# Patient Record
Sex: Male | Born: 2005
Health system: Southern US, Community
[De-identification: ages and names within clinical notes are randomized; demographics above are authoritative.]

## PROBLEM LIST (undated history)

## (undated) DIAGNOSIS — E230 Hypopituitarism: Secondary | ICD-10-CM

## (undated) DIAGNOSIS — J45909 Unspecified asthma, uncomplicated: Secondary | ICD-10-CM

## (undated) DIAGNOSIS — E236 Other disorders of pituitary gland: Secondary | ICD-10-CM

## (undated) DIAGNOSIS — T7840XA Allergy, unspecified, initial encounter: Secondary | ICD-10-CM

## (undated) DIAGNOSIS — F909 Attention-deficit hyperactivity disorder, unspecified type: Secondary | ICD-10-CM

## (undated) DIAGNOSIS — F32A Depression, unspecified: Secondary | ICD-10-CM

## (undated) DIAGNOSIS — L709 Acne, unspecified: Secondary | ICD-10-CM

## (undated) DIAGNOSIS — J302 Other seasonal allergic rhinitis: Secondary | ICD-10-CM

## (undated) DIAGNOSIS — Z8669 Personal history of other diseases of the nervous system and sense organs: Secondary | ICD-10-CM

## (undated) DIAGNOSIS — R519 Headache, unspecified: Secondary | ICD-10-CM

## (undated) DIAGNOSIS — Z91018 Allergy to other foods: Secondary | ICD-10-CM

## (undated) HISTORY — DX: Allergy, unspecified, initial encounter: T78.40XA

## (undated) HISTORY — DX: Personal history of other diseases of the nervous system and sense organs: Z86.69

## (undated) HISTORY — DX: Other seasonal allergic rhinitis: J30.2

## (undated) HISTORY — DX: Attention-deficit hyperactivity disorder, unspecified type: F90.9

## (undated) HISTORY — DX: Depression, unspecified: F32.A

## (undated) HISTORY — DX: Hypopituitarism: E23.0

## (undated) HISTORY — DX: Acne, unspecified: L70.9

## (undated) HISTORY — DX: Other disorders of pituitary gland: E23.6

## (undated) HISTORY — DX: Headache, unspecified: R51.9

---
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
## 2024-03-07 ENCOUNTER — Telehealth: Payer: Self-pay | Admitting: *Deleted

## 2024-03-07 ENCOUNTER — Encounter: Payer: Self-pay | Admitting: Internal Medicine

## 2024-03-07 NOTE — Telephone Encounter (Signed)
 Copied from CRM [id number]. Topic: Appointments - Transfer of Care >> Mar 07, 2024 10:24 AM Cammy Copa D wrote: Pt is requesting to transfer FROM: Aggie Hacker, MD Pt is requesting to transfer TO: Lula Olszewski, MD Reason for requested transfer: PT is turning 18 in May and will no longer be a pediatric patient, needs an adult doctor. It is the responsibility of the team the patient would like to transfer to (Dr. Lula Olszewski, MD) to reach out to the patient if for any reason this transfer is not acceptable.

## 2024-05-14 ENCOUNTER — Telehealth: Payer: Self-pay

## 2024-05-14 NOTE — Telephone Encounter (Signed)
 Communication  Reason for CRM: Patient called because he was advised that he had to reschedule his appointment with Dr. Johnette Naegeli from 05/28/24 and the next available appointment is 06/20/24 and he is concerned because he will run out of his ADHD medication: Amphetamine  Sulfate (EVEKEO ) 10 MG TABS [[id number]] before then. Patient is requesting assistance on what to do before appointment without his refill.   Pt rescheduled so he will not run out of meds.

## 2024-05-20 ENCOUNTER — Other Ambulatory Visit: Payer: Self-pay

## 2024-05-23 ENCOUNTER — Encounter: Payer: Self-pay | Admitting: Family Medicine

## 2024-05-23 ENCOUNTER — Ambulatory Visit: Payer: Self-pay | Admitting: Family Medicine

## 2024-05-23 VITALS — BP 103/67 | HR 85 | Temp 98.4°F | Resp 12 | Ht 69.75 in | Wt 144.2 lb

## 2024-05-23 DIAGNOSIS — Z0000 Encounter for general adult medical examination without abnormal findings: Secondary | ICD-10-CM | POA: Diagnosis not present

## 2024-05-23 DIAGNOSIS — F902 Attention-deficit hyperactivity disorder, combined type: Secondary | ICD-10-CM

## 2024-05-23 NOTE — Patient Instructions (Signed)
 Health Maintenance, Male  Adopting a healthy lifestyle and getting preventive care are important in promoting health and wellness. Ask your health care provider about:  The right schedule for you to have regular tests and exams.  Things you can do on your own to prevent diseases and keep yourself healthy.  What should I know about diet, weight, and exercise?  Eat a healthy diet    Eat a diet that includes plenty of vegetables, fruits, low-fat dairy products, and lean protein.  Do not eat a lot of foods that are high in solid fats, added sugars, or sodium.  Maintain a healthy weight  Body mass index (BMI) is a measurement that can be used to identify possible weight problems. It estimates body fat based on height and weight. Your health care provider can help determine your BMI and help you achieve or maintain a healthy weight.  Get regular exercise  Get regular exercise. This is one of the most important things you can do for your health. Most adults should:  Exercise for at least 150 minutes each week. The exercise should increase your heart rate and make you sweat (moderate-intensity exercise).  Do strengthening exercises at least twice a week. This is in addition to the moderate-intensity exercise.  Spend less time sitting. Even light physical activity can be beneficial.  Watch cholesterol and blood lipids  Have your blood tested for lipids and cholesterol at 19 years of age, then have this test every 5 years.  You may need to have your cholesterol levels checked more often if:  Your lipid or cholesterol levels are high.  You are older than 19 years of age.  You are at high risk for heart disease.  What should I know about cancer screening?  Many types of cancers can be detected early and may often be prevented. Depending on your health history and family history, you may need to have cancer screening at various ages. This may include screening for:  Colorectal cancer.  Prostate cancer.  Skin cancer.  Lung  cancer.  What should I know about heart disease, diabetes, and high blood pressure?  Blood pressure and heart disease  High blood pressure causes heart disease and increases the risk of stroke. This is more likely to develop in people who have high blood pressure readings or are overweight.  Talk with your health care provider about your target blood pressure readings.  Have your blood pressure checked:  Every 3-5 years if you are 9-95 years of age.  Every year if you are 85 years old or older.  If you are between the ages of 29 and 29 and are a current or former smoker, ask your health care provider if you should have a one-time screening for abdominal aortic aneurysm (AAA).  Diabetes  Have regular diabetes screenings. This checks your fasting blood sugar level. Have the screening done:  Once every three years after age 23 if you are at a normal weight and have a low risk for diabetes.  More often and at a younger age if you are overweight or have a high risk for diabetes.  What should I know about preventing infection?  Hepatitis B  If you have a higher risk for hepatitis B, you should be screened for this virus. Talk with your health care provider to find out if you are at risk for hepatitis B infection.  Hepatitis C  Blood testing is recommended for:  Everyone born from [birth year] through 1966.  Anyone  with known risk factors for hepatitis C.  Sexually transmitted infections (STIs)  You should be screened each year for STIs, including gonorrhea and chlamydia, if:  You are sexually active and are younger than 19 years of age.  You are older than 19 years of age and your health care provider tells you that you are at risk for this type of infection.  Your sexual activity has changed since you were last screened, and you are at increased risk for chlamydia or gonorrhea. Ask your health care provider if you are at risk.  Ask your health care provider about whether you are at high risk for HIV. Your health care provider  may recommend a prescription medicine to help prevent HIV infection. If you choose to take medicine to prevent HIV, you should first get tested for HIV. You should then be tested every 3 months for as long as you are taking the medicine.  Follow these instructions at home:  Alcohol use  Do not drink alcohol if your health care provider tells you not to drink.  If you drink alcohol:  Limit how much you have to 0-2 drinks a day.  Know how much alcohol is in your drink. In the [place] one drink equals one 12 oz bottle of beer (355 mL), one 5 oz glass of wine (148 mL), or one 1 oz glass of hard liquor (44 mL).  Lifestyle  Do not use any products that contain nicotine or tobacco. These products include cigarettes, chewing tobacco, and vaping devices, such as e-cigarettes. If you need help quitting, ask your health care provider.  Do not use street drugs.  Do not share needles.  Ask your health care provider for help if you need support or information about quitting drugs.  General instructions  Schedule regular health, dental, and eye exams.  Stay current with your vaccines.  Tell your health care provider if:  You often feel depressed.  You have ever been abused or do not feel safe at home.  Summary  Adopting a healthy lifestyle and getting preventive care are important in promoting health and wellness.  Follow your health care provider's instructions about healthy diet, exercising, and getting tested or screened for diseases.  Follow your health care provider's instructions on monitoring your cholesterol and blood pressure.  This information is not intended to replace advice given to you by your health care provider. Make sure you discuss any questions you have with your health care provider.  Document Revised: 04/11/2021 Document Reviewed: 04/11/2021  Elsevier Patient Education  2024 [employer].

## 2024-05-23 NOTE — Progress Notes (Signed)
 Office Note 05/23/2024  CC:  Chief Complaint  Patient presents with   Establish Care    HPI:  Duane Ward is a 18 y.o. male who is here accompanied by his mom to establish care, discuss ADHD. Patient's most recent primary MD: Dr. Candelaria Chaco, pediatrics Old records in [facility] EMR were reviewed prior to or during today's visit.  Feeling well. Has been on Ezekiel 10 mg 1-2 times a day and guanfacine  3 mg a day for 10 years.  He feels like this is helpful/effective for his ADHD symptoms. He also takes sertraline  75 mg a day for history of anxiety and depression. He has been stable on this for a long time as well.    He graduated from [employer].  Plans on attending [employer] in the fall as a mechanical engineering major, hopes to have a focus in aerodynamics.  He did have a history of growth hormone deficiency and was on growth hormone for quite a while.  He reached normal growth and plateaued/showed stability so he has been released from endocrinology. Additionally, he had a pituitary growth that neurology monitored over quite a while and it was stable so he has been released from neurology follow-up.  He does get annual optometry follow-up to make sure there are no peripheral field visual abnormalities occurring.  Past Medical History:  Diagnosis Date   ADHD    Asthma    Depression    Growth hormone deficiency (HCC)    released from specialist care   Hx of migraines    Multiple food allergies    Pituitary mass (HCC)    Released from specialist care due to stability.   Seasonal allergies     History reviewed. No pertinent surgical history.  Family History  Problem Relation Age of Onset   Asthma Mother    Birth defects Mother    Depression Mother    Learning disabilities Father    Birth defects Brother    Hyperlipidemia Maternal Grandfather    Hypertension Maternal Grandfather    Heart attack Maternal Grandfather    Heart disease Maternal  Grandfather    Cancer Paternal Grandfather    Depression Paternal Grandfather    Hyperlipidemia Paternal Grandfather    Asthma Maternal Aunt     Social History   Socioeconomic History   Marital status: Single    Spouse name: Not on file   Number of children: Not on file   Years of education: Not on file   Highest education level: Not on file  Occupational History   Not on file  Tobacco Use   Smoking status: Never   Smokeless tobacco: Never  Vaping Use   Vaping status: Never Used  Substance and Sexual Activity   Alcohol use: Never   Drug use: Never   Sexual activity: Never  Other Topics Concern   Not on file  Social History Narrative   [employer] high [employer] grad.  Has 1 younger brother.   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   Social Drivers of Corporate investment banker Strain: Not on file  Food Insecurity: Not on file  Transportation Needs: Not on file  Physical Activity: Not on file  Stress: Not on file  Social Connections: Unknown (01/08/2023)   Received from [facility]   Social Network    Social Network: Not on file  Intimate Partner Violence: Unknown (01/08/2023)   Received from [facility]  HITS    Physically Hurt: Not on file    Insult or Talk Down To: Not on file    Threaten Physical Harm: Not on file    Scream or Curse: Not on file    Outpatient Encounter Medications as of 05/23/2024  Medication Sig   albuterol  (VENTOLIN  HFA) 108 (90 Base) MCG/ACT inhaler Inhale 1-2 puffs into the lungs every 4 (four) hours as needed for wheezing (or cough).   Amphetamine  Sulfate (EVEKEO ) 10 MG TABS Take 1 tablet by mouth 2 (two) times daily as needed.   Budesonide (PULMICORT FLEXHALER) 90 MCG/ACT inhaler Inhale 1 puff into the lungs 2 (two) times daily.   EPINEPHrine 0.3 mg/0.3 mL IJ SOAJ injection Inject 0.3 mg into the muscle once as needed (Anaphylaxis).   GuanFACINE  HCl 3 MG TB24 Take 1 tablet (3 mg total) by mouth  daily.   Saccharomyces boulardii (PROBIOTIC) 250 MG CAPS Take by mouth.   sertraline  (ZOLOFT ) 50 MG tablet Take 75 mg by mouth daily.   No facility-administered encounter medications on file as of 05/23/2024.    Allergies  Allergen Reactions   Egg-Derived Products Anaphylaxis    Review of Systems  Constitutional:  Negative for appetite change, chills, fatigue and fever.  HENT:  Negative for congestion, dental problem, ear pain and sore throat.   Eyes:  Negative for discharge, redness and visual disturbance.  Respiratory:  Negative for cough, chest tightness, shortness of breath and wheezing.   Cardiovascular:  Negative for chest pain, palpitations and leg swelling.  Gastrointestinal:  Negative for abdominal pain, blood in stool, diarrhea, nausea and vomiting.  Genitourinary:  Negative for difficulty urinating, dysuria, flank pain, frequency, hematuria and urgency.  Musculoskeletal:  Negative for arthralgias, back pain, joint swelling, myalgias and neck stiffness.  Skin:  Negative for pallor and rash.  Neurological:  Negative for dizziness, speech difficulty, weakness and headaches.  Hematological:  Negative for adenopathy. Does not bruise/bleed easily.  Psychiatric/Behavioral:  Negative for confusion and sleep disturbance. The patient is not nervous/anxious.     PE; Blood pressure 103/67, pulse 85, temperature 98.4 F (36.9 C), temperature source Oral, resp. rate 12, height 5' 9.75 (1.772 m), weight 144 lb 3.2 oz (65.4 kg), SpO2 98%. Body mass index is 20.84 kg/m.  Physical Exam  Gen: Alert, well appearing.  Patient is oriented to person, place, time, and situation. AFFECT: pleasant, lucid thought and speech. ENT: Ears: EACs clear, normal epithelium.  TMs with good light reflex and landmarks bilaterally.  Eyes: no injection, icteris, swelling, or exudate.  EOMI, PERRLA. Nose: no drainage or turbinate edema/swelling.  No injection or focal lesion.  Mouth: lips without  lesion/swelling.  Oral mucosa pink and moist.  Dentition intact and without obvious caries or gingival swelling.  Oropharynx without erythema, exudate, or swelling.  Neck: supple/nontender.  No LAD, mass, or TM.  Carotid pulses 2+ bilaterally, without bruits. CV: RRR, no m/r/g.   LUNGS: CTA bilat, nonlabored resps, good aeration in all lung fields. ABD: soft, NT, ND, BS normal.  No hepatospenomegaly or mass.  No bruits. EXT: no clubbing, cyanosis, or edema.  Musculoskeletal: no joint swelling, erythema, warmth, or tenderness.  ROM of all joints intact. Skin - no sores or suspicious lesions or rashes or color changes  Pertinent labs:  none  ASSESSMENT AND PLAN:   New patient, establishing care.  #1 health maintenance exam: Reviewed age and gender appropriate health maintenance issues (prudent diet, regular exercise, health risks of tobacco and excessive alcohol, use of  seatbelts, fire alarms in home, use of sunscreen).  Also reviewed age and gender appropriate health screening as well as vaccine recommendations. Vaccines: None Labs: None  #2 ADHD, doing well long-term on if Evekeo  10 mg twice daily and guanfacine  3 mg a day. Additionally, he is stable on sertraline  50 mg tabs, 1-1/2 daily for anxiety/depression. He will call later this summer for refills.  An After Visit Summary was printed and given to the patient.  Return in about 6 months (around 11/22/2024) for f/u ADD.  Signed:  Arletha Lady, MD           05/23/2024

## 2024-05-28 ENCOUNTER — Ambulatory Visit: Payer: Self-pay | Admitting: Family Medicine

## 2024-06-09 ENCOUNTER — Ambulatory Visit: Payer: Self-pay | Admitting: Internal Medicine

## 2024-06-18 ENCOUNTER — Other Ambulatory Visit: Payer: Self-pay | Admitting: Family Medicine

## 2024-06-18 NOTE — Telephone Encounter (Unsigned)
 Copied from CRM [id number]. Topic: Clinical - Medication Refill >> Jun 18, 2024 11:42 AM Shereese L wrote: Medication: sertraline  (ZOLOFT ) 50 MG tablet GuanFACINE  HCl 3 MG TB24  Has the patient contacted their pharmacy? Yes (Agent: If no, request that the patient contact the pharmacy for the refill. If patient does not wish to contact the pharmacy document the reason why and proceed with request.) (Agent: If yes, when and what did the pharmacy advise?)  This is the patient's preferred pharmacy:  [facility] - [place], [place] - [address] [place] [address] Phone: [phone] Fax: [phone]  Phone: [phone] Fax: [phone]  Is this the correct pharmacy for this prescription? Yes If no, delete pharmacy and type the correct one.   Has the prescription been filled recently? Yes  Is the patient out of the medication? Yes  Has the patient been seen for an appointment in the last year OR does the patient have an upcoming appointment? Yes  Can we respond through MyChart? Yes  Agent: Please be advised that Rx refills may take up to 3 business days. We ask that you follow-up with your pharmacy.

## 2024-06-20 ENCOUNTER — Ambulatory Visit: Payer: Self-pay | Admitting: Family Medicine

## 2024-06-24 ENCOUNTER — Other Ambulatory Visit: Payer: Self-pay

## 2024-06-24 MED ORDER — SERTRALINE HCL 50 MG PO TABS: 75.0000 mg | ORAL_TABLET | Freq: Every day | ORAL | 3 refills | Status: DC

## 2024-06-24 MED ORDER — GUANFACINE HCL ER 3 MG PO TB24: 3.0000 mg | ORAL_TABLET | Freq: Every day | ORAL | 3 refills | Status: DC

## 2024-06-24 NOTE — Telephone Encounter (Signed)
 Source  Duane Ward (Patient)   Subject  Duane Ward (Patient)   Topic  Clinical - Medication Refill    Communication  Medication: sertraline  (ZOLOFT ) 50 MG tablet    GuanFACINE  HCl 3 MG TB24        Has the patient contacted their pharmacy? Yes    (Agent: If no, request that the patient contact the pharmacy for the refill. If patient does not wish to contact the pharmacy document the reason why and proceed with request.)    (Agent: If yes, when and what did the pharmacy advise?)        This is the patient's preferred pharmacy:    [facility] - [place], [place] - [address]    [address]    [place] [address]    Phone: [phone] Fax: [phone]        Phone: [phone] Fax: [phone]        Is this the correct pharmacy for this prescription? Yes    If no, delete pharmacy and type the correct one.        Has the prescription been filled recently? Yes        Is the patient out of the medication? Yes        Has the patient been seen for an appointment in the last year OR does the patient have an upcoming appointment? Yes        Can we respond through MyChart? Yes        Agent: Please be advised that Rx refills may take up to 3 business days. We ask that you follow-up with your pharmacy.

## 2024-07-08 ENCOUNTER — Other Ambulatory Visit: Payer: Self-pay

## 2024-07-08 MED ORDER — AMPHETAMINE SULFATE 10 MG PO TABS: 1.0000 | ORAL_TABLET | Freq: Two times a day (BID) | ORAL | 0 refills | Status: DC | PRN

## 2024-07-08 NOTE — Telephone Encounter (Signed)
 Requesting: adderall Contract: N/A UDS: N/A Last Visit: 05/23/24 Next Visit: 11/24/24 Last Refill: not previously prescribed by PCP, pt new to office  Please Advise. Rx pending

## 2024-07-24 ENCOUNTER — Encounter: Payer: Self-pay | Admitting: Family Medicine

## 2024-07-25 ENCOUNTER — Other Ambulatory Visit: Payer: Self-pay

## 2024-07-25 MED ORDER — SERTRALINE HCL 50 MG PO TABS: 75.0000 mg | ORAL_TABLET | Freq: Every day | ORAL | 1 refills | Status: DC

## 2024-09-22 ENCOUNTER — Other Ambulatory Visit: Payer: Self-pay | Admitting: Family Medicine

## 2024-09-22 NOTE — Telephone Encounter (Unsigned)
 Copied from CRM #[id number]. Topic: Clinical - Medication Refill >> Sep 22, 2024 11:07 AM Tanazia G wrote: Medication: Amphetamine  Sulfate (EVEKEO ) 10 MG TABS  Has the patient contacted their pharmacy? Yes (Agent: If no, request that the patient contact the pharmacy for the refill. If patient does not wish to contact the pharmacy document the reason why and proceed with request.) (Agent: If yes, when and what did the pharmacy advise?)  This is the patient's preferred pharmacy:  [facility] - [place], [place] - [address] [place] [address] Phone: [phone] Fax: [phone]  Is this the correct pharmacy for this prescription? Yes If no, delete pharmacy and type the correct one.   Has the prescription been filled recently? Yes  Is the patient out of the medication? No  Has the patient been seen for an appointment in the last year OR does the patient have an upcoming appointment? Yes  Can we respond through MyChart? Yes  Agent: Please be advised that Rx refills may take up to 3 business days. We ask that you follow-up with your pharmacy.

## 2024-09-23 MED ORDER — AMPHETAMINE SULFATE 10 MG PO TABS: 1.0000 | ORAL_TABLET | Freq: Two times a day (BID) | ORAL | 0 refills | Status: DC | PRN

## 2024-09-23 NOTE — Telephone Encounter (Signed)
 Requesting: evekeo  10mg  Contract:N/A UDS: N/A Last Visit: 05/23/24 Next Visit: 11/24/24 Last Refill: 07/08/24 (60,0)  Please Advise. Rx pending

## 2024-11-24 ENCOUNTER — Ambulatory Visit: Admitting: Family Medicine

## 2024-11-24 ENCOUNTER — Encounter: Payer: Self-pay | Admitting: Family Medicine

## 2024-11-24 VITALS — BP 97/59 | HR 89 | Temp 97.2°F | Ht 69.75 in | Wt 139.4 lb

## 2024-11-24 DIAGNOSIS — F902 Attention-deficit hyperactivity disorder, combined type: Secondary | ICD-10-CM | POA: Diagnosis not present

## 2024-11-24 DIAGNOSIS — F411 Generalized anxiety disorder: Secondary | ICD-10-CM

## 2024-11-24 MED ORDER — AMPHETAMINE SULFATE 10 MG PO TABS: 1.0000 | ORAL_TABLET | Freq: Two times a day (BID) | ORAL | 0 refills | Status: AC | PRN

## 2024-11-24 NOTE — Progress Notes (Signed)
 OFFICE VISIT  11/24/2024  CC:  Chief Complaint  Patient presents with   Medical Management of Chronic Issues    Patient is a 18 y.o. male who presents for 35-month follow-up ADHD and anxiety. A/P as of last visit:  ADHD, doing well long-term on if Evekeo  10 mg twice daily and guanfacine  3 mg a day. Additionally, he is stable on sertraline  50 mg tabs, 1-1/2 daily for anxiety/depression. He will call later this summer for refills.  INTERIM HX: Marolyn is doing well. School in [place] has been good.  He is home on break.  Pt states all is going well with the med at current dosing: much improved focus, concentration, task completion.  Less frustration, better multitasking, less impulsivity and restlessness.  Mood is stable.  Anxiety levels well-controlled. No side effects from the medication.   Past Medical History:  Diagnosis Date   ADHD    Asthma    Depression    Growth hormone deficiency    released from specialist care   Hx of migraines    Multiple food allergies    Pituitary mass    Released from specialist care due to stability.   Poor weight gain (0-17) 04/24/2017   Seasonal allergies     History reviewed. No pertinent surgical history.  Outpatient Medications Prior to Visit  Medication Sig Dispense Refill   albuterol  (VENTOLIN  HFA) 108 (90 Base) MCG/ACT inhaler Inhale 1-2 puffs into the lungs every 4 (four) hours as needed for wheezing (or cough).     Budesonide (PULMICORT FLEXHALER) 90 MCG/ACT inhaler Inhale 1 puff into the lungs 2 (two) times daily.     EPINEPHrine 0.3 mg/0.3 mL IJ SOAJ injection Inject 0.3 mg into the muscle once as needed (Anaphylaxis).     Saccharomyces boulardii (PROBIOTIC) 250 MG CAPS Take by mouth.     Amphetamine  Sulfate (EVEKEO ) 10 MG TABS Take 1 tablet (10 mg total) by mouth 2 (two) times daily as needed. 60 tablet 0   GuanFACINE  HCl 3 MG TB24 Take 1 tablet (3 mg total) by mouth daily. 90 tablet 3   sertraline  (ZOLOFT ) 50 MG tablet Take  1.5 tablets (75 mg total) by mouth daily. 135 tablet 1   No facility-administered medications prior to visit.    Allergies[1]  Review of Systems As per HPI  PE:    11/24/2024    9:57 AM 05/23/2024   10:16 AM 05/23/2024   10:12 AM  Vitals with BMI  Height 5' 9.75 5' 9.75 5' 9.75  Weight 139 lbs 6 oz 144 lbs 3 oz 144 lbs 3 oz  BMI 20.14 20.83 20.83  Systolic 97 103 103  Diastolic 59 67 67  Pulse 89 85 85     Physical Exam  Wt Readings from Last 2 Encounters:  11/24/24 139 lb 6.4 oz (63.2 kg) (25%, Z= -0.67)*  05/23/24 144 lb 3.2 oz (65.4 kg) (36%, Z= -0.37)*   * Growth percentiles are based on CDC (Boys, 2-20 Years) data.    Gen: alert, oriented x 4, affect pleasant.  Lucid thinking and conversation noted. HEENT: PERRLA, EOMI.   Neck: no LAD, mass, or thyromegaly. CV: RRR, no m/r/g LUNGS: CTA bilat, nonlabored. NEURO: no tremor or tics noted on observation.  Coordination intact. CN 2-12 grossly intact bilaterally, strength 5/5 in all extremeties.  No ataxia.  LABS: none  IMPRESSION AND PLAN:  #1 ADHD, GAD Doing well. Continue Effico 10 mg twice daily as needed.  New prescription today. Continue sertraline  50 mg  tab, 1.5 tab daily. Continue guanfacine  sustained-release 3 mg tab every evening.  An After Visit Summary was printed and given to the patient.  FOLLOW UP: Return in about 6 months (around 05/25/2025) for routine chronic illness f/u.  Signed:  Gerlene Hockey, MD           11/24/2024     [1]  Allergies Allergen Reactions   Egg Protein-Containing Drug Products Anaphylaxis
